# Patient Record
Sex: Female | Born: 1965 | Race: White | Hispanic: No | Smoking: Former smoker
Health system: Southern US, Community
[De-identification: ages and names within clinical notes are randomized; demographics above are authoritative.]

## PROBLEM LIST (undated history)

## (undated) DIAGNOSIS — K219 Gastro-esophageal reflux disease without esophagitis: Secondary | ICD-10-CM

## (undated) DIAGNOSIS — IMO0002 Reserved for concepts with insufficient information to code with codable children: Secondary | ICD-10-CM

## (undated) DIAGNOSIS — M329 Systemic lupus erythematosus, unspecified: Secondary | ICD-10-CM

## (undated) DIAGNOSIS — M069 Rheumatoid arthritis, unspecified: Secondary | ICD-10-CM

## (undated) DIAGNOSIS — I10 Essential (primary) hypertension: Secondary | ICD-10-CM

## (undated) DIAGNOSIS — E282 Polycystic ovarian syndrome: Secondary | ICD-10-CM

## (undated) DIAGNOSIS — M797 Fibromyalgia: Secondary | ICD-10-CM

## (undated) DIAGNOSIS — E039 Hypothyroidism, unspecified: Secondary | ICD-10-CM

## (undated) HISTORY — PX: TURBINATE REDUCTION: SHX6157

## (undated) HISTORY — PX: TUBAL LIGATION: SHX77

## (undated) HISTORY — PX: WISDOM TOOTH EXTRACTION: SHX21

## (undated) HISTORY — PX: CARPAL TUNNEL RELEASE: SHX101

## (undated) HISTORY — PX: BLADDER SUSPENSION: SHX72

## (undated) HISTORY — PX: BREAST BIOPSY: SHX20

## (undated) HISTORY — PX: TONSILLECTOMY: SUR1361

---

## 2017-11-04 HISTORY — PX: BREAST BIOPSY: SHX20

## 2018-04-16 ENCOUNTER — Inpatient Hospital Stay
Admission: RE | Admit: 2018-04-16 | Discharge: 2018-04-16 | Disposition: A | Discharge status: Home / Self Care | Payer: Self-pay | Source: Ambulatory Visit | Attending: *Deleted | Admitting: *Deleted

## 2018-04-16 ENCOUNTER — Other Ambulatory Visit: Payer: Self-pay | Admitting: *Deleted

## 2018-04-16 DIAGNOSIS — Z9289 Personal history of other medical treatment: Secondary | ICD-10-CM

## 2018-04-17 ENCOUNTER — Other Ambulatory Visit: Payer: Self-pay | Admitting: Family Medicine

## 2018-04-17 ENCOUNTER — Other Ambulatory Visit: Payer: Self-pay | Admitting: *Deleted

## 2018-04-17 ENCOUNTER — Inpatient Hospital Stay
Admission: RE | Admit: 2018-04-17 | Discharge: 2018-04-17 | Disposition: A | Discharge status: Home / Self Care | Payer: Self-pay | Source: Ambulatory Visit | Attending: *Deleted | Admitting: *Deleted

## 2018-04-17 DIAGNOSIS — Z9289 Personal history of other medical treatment: Secondary | ICD-10-CM

## 2018-04-17 DIAGNOSIS — R921 Mammographic calcification found on diagnostic imaging of breast: Secondary | ICD-10-CM

## 2018-04-17 DIAGNOSIS — R928 Other abnormal and inconclusive findings on diagnostic imaging of breast: Secondary | ICD-10-CM

## 2018-05-01 ENCOUNTER — Ambulatory Visit
Admission: RE | Admit: 2018-05-01 | Discharge: 2018-05-01 | Disposition: A | Discharge status: Home / Self Care | Payer: Self-pay | Source: Ambulatory Visit | Attending: Family Medicine | Admitting: Family Medicine

## 2018-05-01 ENCOUNTER — Encounter: Payer: Self-pay | Admitting: Radiology

## 2018-05-01 DIAGNOSIS — R928 Other abnormal and inconclusive findings on diagnostic imaging of breast: Secondary | ICD-10-CM | POA: Insufficient documentation

## 2018-05-18 ENCOUNTER — Other Ambulatory Visit: Payer: Self-pay | Admitting: *Deleted

## 2018-05-18 ENCOUNTER — Inpatient Hospital Stay
Admission: RE | Admit: 2018-05-18 | Discharge: 2018-05-18 | Disposition: A | Discharge status: Home / Self Care | Payer: Self-pay | Source: Ambulatory Visit | Attending: *Deleted | Admitting: *Deleted

## 2018-05-18 DIAGNOSIS — Z9289 Personal history of other medical treatment: Secondary | ICD-10-CM

## 2018-05-22 ENCOUNTER — Ambulatory Visit
Admission: RE | Admit: 2018-05-22 | Discharge: 2018-05-22 | Disposition: A | Discharge status: Home / Self Care | Payer: Self-pay | Source: Ambulatory Visit | Attending: Family Medicine | Admitting: Family Medicine

## 2018-05-22 DIAGNOSIS — R928 Other abnormal and inconclusive findings on diagnostic imaging of breast: Secondary | ICD-10-CM | POA: Insufficient documentation

## 2018-05-25 ENCOUNTER — Other Ambulatory Visit: Payer: Self-pay | Admitting: Family Medicine

## 2018-05-25 DIAGNOSIS — R928 Other abnormal and inconclusive findings on diagnostic imaging of breast: Secondary | ICD-10-CM

## 2018-05-26 ENCOUNTER — Encounter: Payer: Self-pay | Admitting: *Deleted

## 2018-05-26 NOTE — Progress Notes (Signed)
  Oncology Nurse Navigator Documentation  Navigator Location: [facility] (05/26/18 0900)   )Navigator Encounter Type: Telephone (05/26/18 0900) Telephone: Lahoma Crocker Call (05/26/18 0900) Abnormal Finding Date: 05/22/18 (05/26/18 0900)                     Barriers/Navigation Needs: Financial;Coordination of Care (05/26/18 0900)   Interventions: Coordination of Care (05/26/18 0900)                      Time Spent with Patient: 15 (05/26/18 0900)   Patient had birads 4 mammogram through our [facility].  Screened patient today for BCCCP eligibility.  She does not meet financial eligibility for BCCCP.  Biopsy can be completed through our [facility].  Will inbasket Samatha.

## 2018-05-29 ENCOUNTER — Ambulatory Visit
Admission: RE | Admit: 2018-05-29 | Discharge: 2018-05-29 | Disposition: A | Discharge status: Home / Self Care | Payer: Self-pay | Source: Ambulatory Visit | Attending: Family Medicine | Admitting: Family Medicine

## 2018-05-29 DIAGNOSIS — R928 Other abnormal and inconclusive findings on diagnostic imaging of breast: Secondary | ICD-10-CM | POA: Insufficient documentation

## 2018-06-01 LAB — SURGICAL PATHOLOGY

## 2018-09-19 IMAGING — MG MM DIGITAL DIAGNOSTIC UNILAT*L* W/ TOMO W/ CAD
6 series · 6 of 10 positions shown · non-contrast
Comparison: Previous exam(s).

CLINICAL DATA: 52-year-old female recalled from outside screening
mammogram dated 02/24/2018 for left breast and left axillary
calcifications.

EXAM:
DIGITAL DIAGNOSTIC LEFT MAMMOGRAM WITH CAD AND TOMO
ULTRASOUND LEFT BREAST

[L CC (1 of 2)]
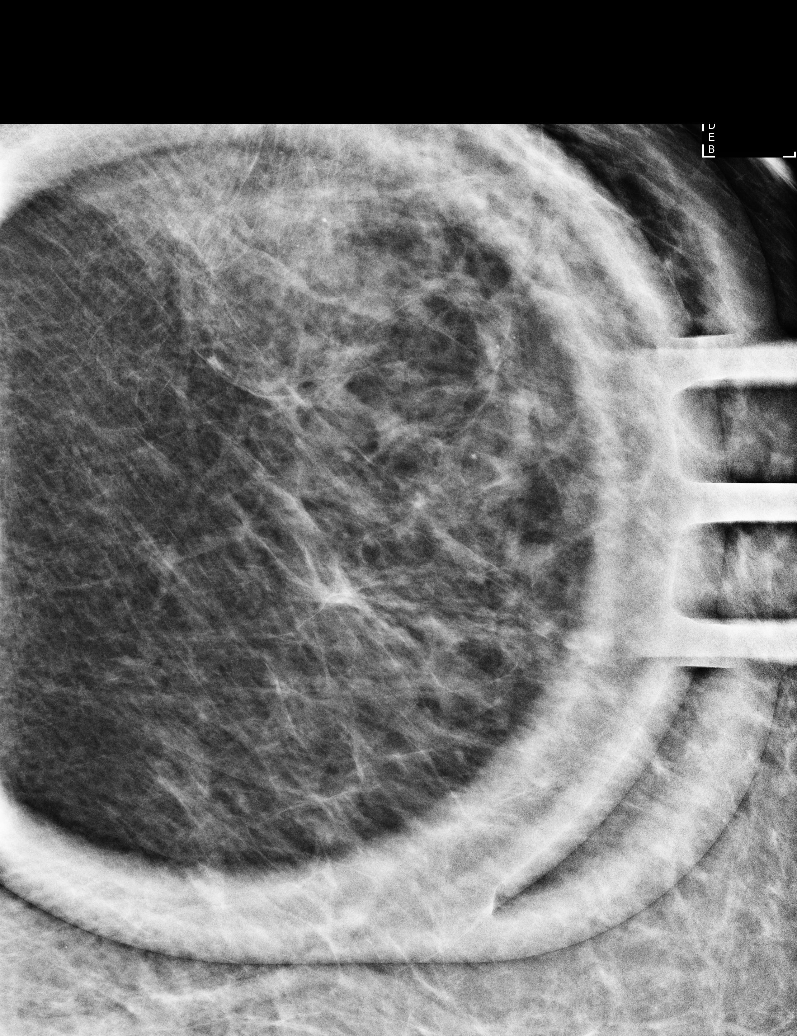

[L ML]
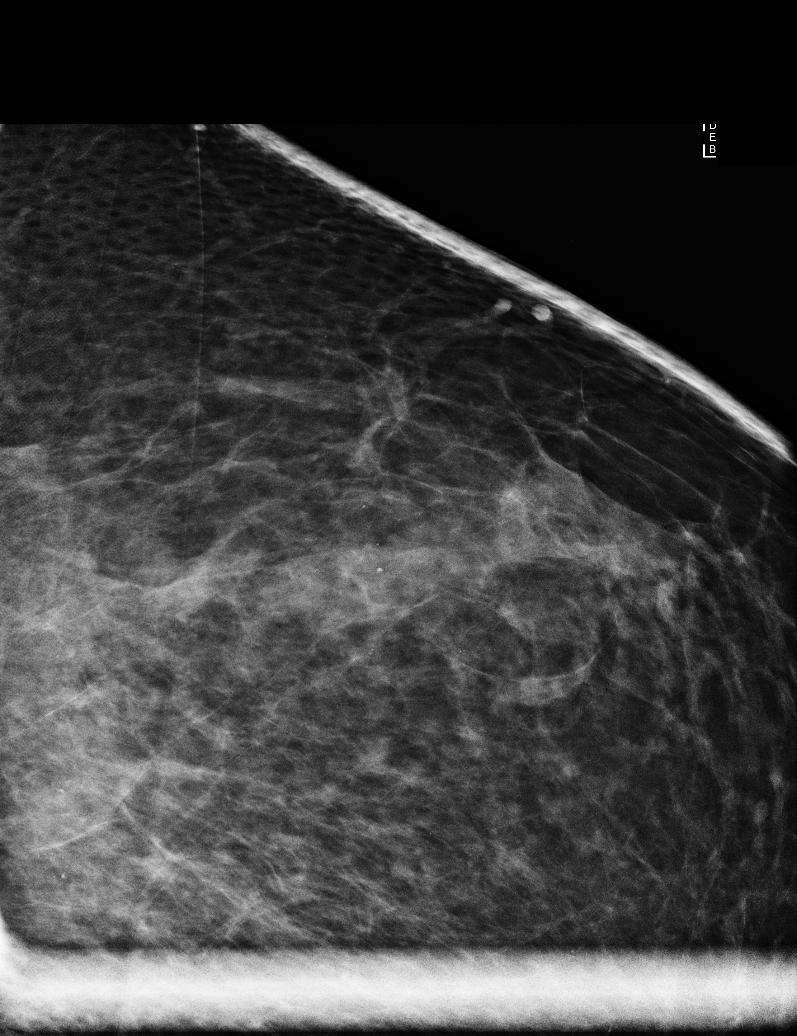

[L MLO]
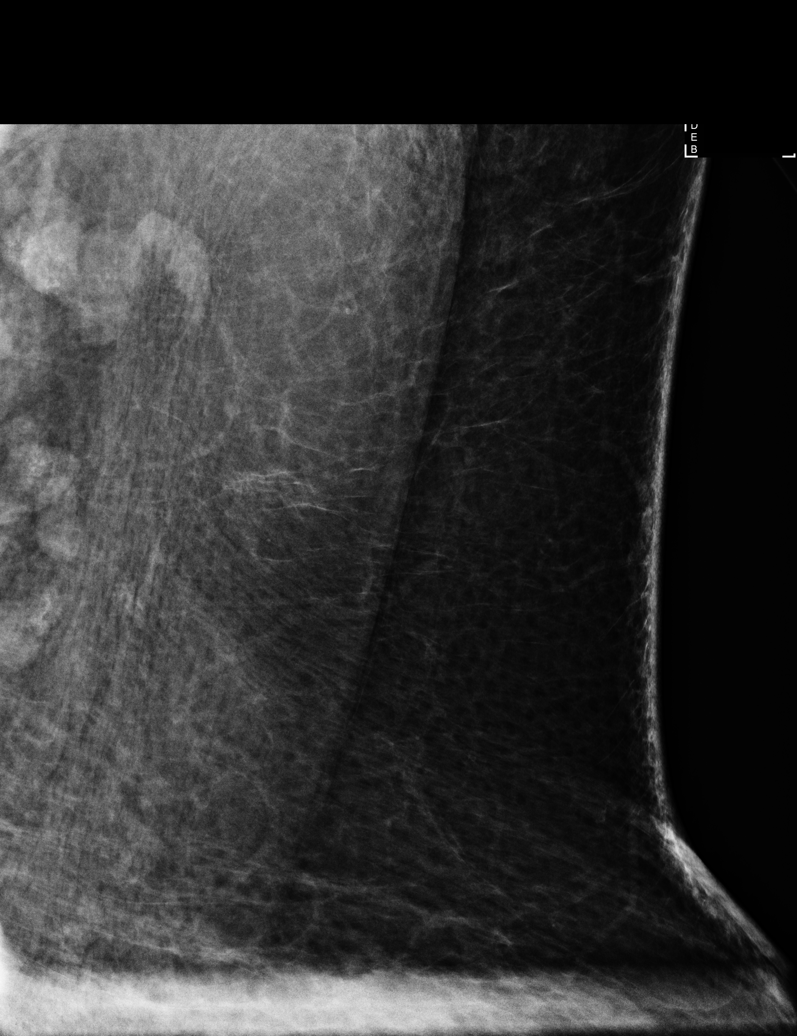

[L CC (2 of 2)]
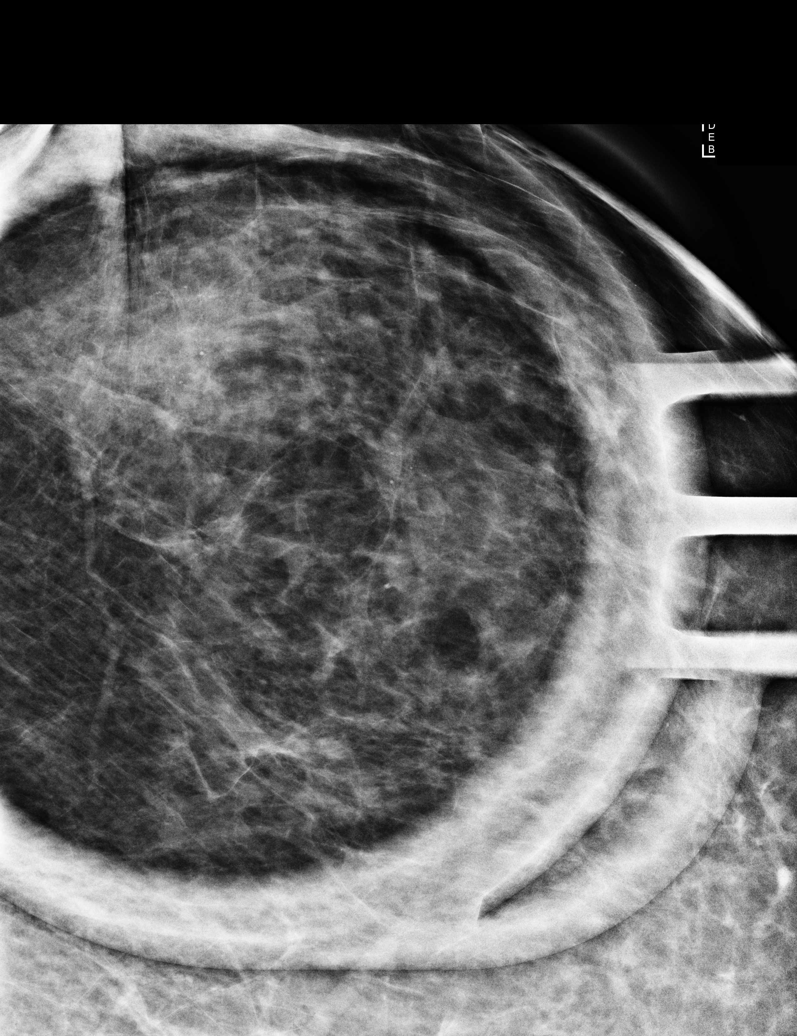

[L ML synth-2D]
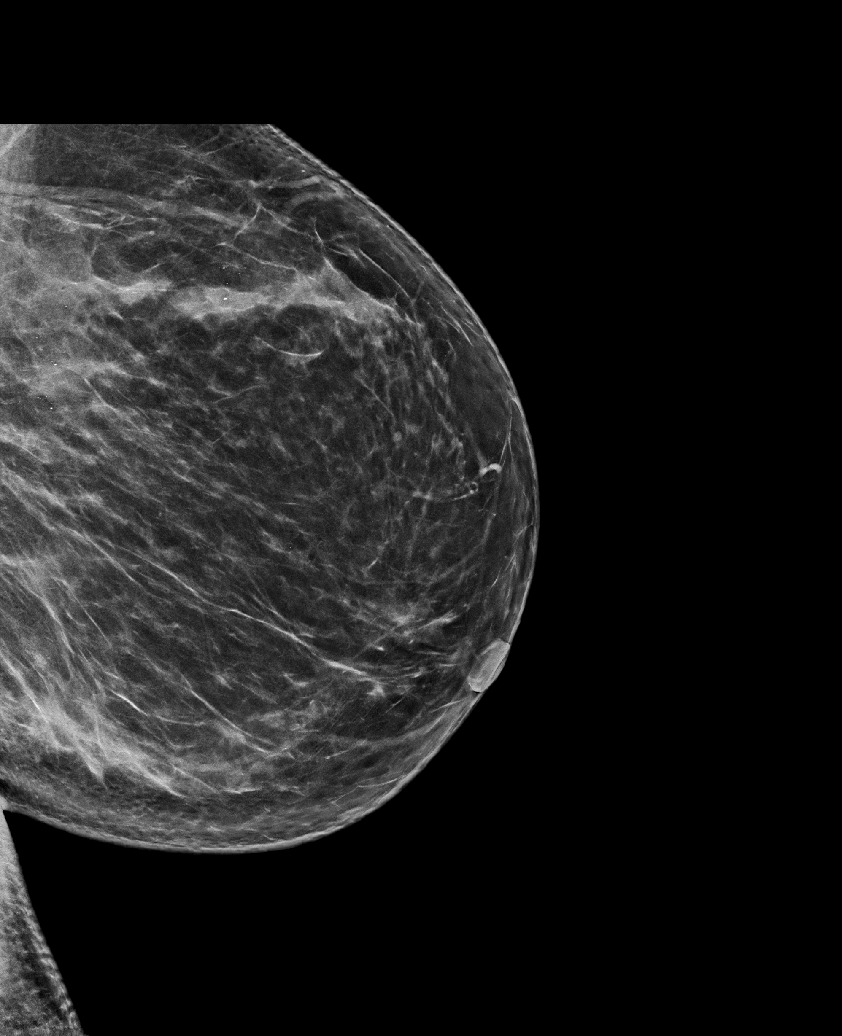

[L ML tomo · tomo slice 41/80.0]
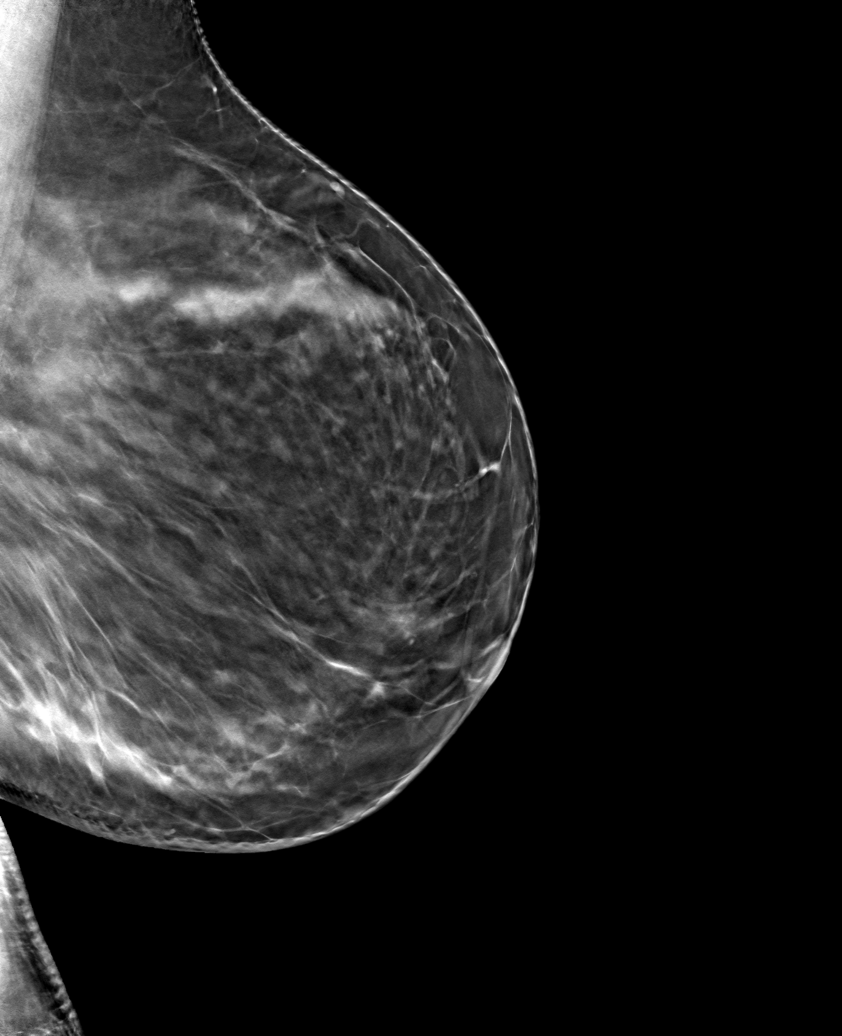

[6 of 10 positions shown; findings below may reference images not displayed]

ACR Breast Density Category c: The breast tissue is heterogeneously
dense, which may obscure small masses.
FINDINGS: Loosely grouped calcifications in the upper-outer quadrant of the
left breast have a punctate morphology. Additional magnification
views of the left axilla demonstrate pleomorphic appearing
calcifications within several left axillary lymph nodes.

Mammographic images were processed with CAD.

On physical exam, note is made of a tattoo along the upper medial
aspect of the patient's left breast.

Targeted ultrasound is performed, showing an indeterminate lymph
node in the left axilla demonstrating up to 0.4 cm of cortical
thickening and internal hyperechoic foci. This likely correlates
with the mammographic finding. Additional morphologically normal
lymph nodes are also noted.
IMPRESSION: 1. Indeterminate left axillary lymph node demonstrating up to 0.4 cm
of cortical thickening and internal hyperechoic foci likely
corresponding with the mammographic finding.
2. Probably benign calcifications in the upper-outer quadrant of the
left breast corresponding with the screening mammographic findings.

RECOMMENDATION:
1. Recommendation is for ultrasound-guided biopsy of 1 of the lymph
nodes demonstrating internal hyperechoic foci with specimen
radiographs to demonstrate adequate sampling of the mammographically
noted calcifications. Findings may be related to pigmentation from
the patient's tattoo.
2. If the above findings are benign, recommendation is for six-month
mammographic follow-up for the left breast calcifications. If the
findings demonstrate malignancy, additional stereotactic biopsy is
recommended.

I have discussed the findings and recommendations with the patient.
Results were also provided in writing at the conclusion of the
visit. If applicable, a reminder letter will be sent to the patient
regarding the next appointment.

BI-RADS CATEGORY  4: Suspicious.

## 2018-10-15 ENCOUNTER — Ambulatory Visit: Payer: Self-pay | Admitting: Nutrition

## 2018-11-10 ENCOUNTER — Encounter: Payer: Self-pay | Admitting: Nutrition

## 2018-11-10 ENCOUNTER — Encounter: Payer: Self-pay | Attending: Family Medicine | Admitting: Nutrition

## 2018-11-10 DIAGNOSIS — R739 Hyperglycemia, unspecified: Secondary | ICD-10-CM | POA: Insufficient documentation

## 2018-11-10 NOTE — Patient Instructions (Signed)
Goals 1 Follow MY Plate 2. Walk 30 minutes a day/150 minutes a week 3. Eat 2 carb choices per meal and be sure to eat protein and veggies with ti. 4. Lose 1 lb per week. Keep food journal daily

## 2018-11-10 NOTE — Progress Notes (Signed)
  Medical Nutrition Therapy:  Appt start time: 1400 end time:  1500.   Assessment:  Primary concerns today: Prediabetes, Obesity BMI 46, Hyperlipidemia and HTN. Lives with her husband.  Currently works part time for her church.  Eating to 3 meals per day.  Has been on Weight watchers, Rickard Patience, 21 day fix, and a keto diet in the past. PCP [facility]. Most she has weighed was 278 lbs. Lost 10 lbs recently.  Desired weight is 175 lbs. She was below 200 lbs 10 yrs ago. Physical activity: Walks some.  Currently  A1C is 5.8% and restarted on Metformin 500 mg BID, Victoza .6 per day. Donna Bernard has made her nauseated and sick on stomach but she is working through it. Had to reduce from 1.2 units a day due to chronic nausea.    Lipid profile elevated TG 161 mg/dl, LDL 139 mg/dl, HDL 46 mg/dl and NON HDL Chol is 157 mg/dl.   She has metabolic syndrome and at risk for cardiovascular complications based on current chronic problems.   Engaged to make changes with diet and exercise to lose weight and improve health. Current diet is inconsistent to meet her needs. Needs to avoid processed meats.   Preferred Learning Style:  No preference indicated   Learning Readiness:   Ready  Change in progress   MEDICATIONS:    DIETARY INTAKE:  24-hr recall:  B ( AM): 2 hard boiled eggs, or banana and PB, water Snk ( AM):  L ( PM):  Chicken nuggets grilled 8, water  Snk ( PM):  D ( PM): 2 pieces of spiral ham,  1 c green beans,, water Snk ( PM):  Beverages: water  Usual physical activity: ADL  Estimated energy needs: 1200  calories 135 g carbohydrates 90 g protein 33 g fat  Progress Towards Goal(s):  In progress.   Nutritional Diagnosis:  NB-1.1 Food and nutrition-related knowledge deficit As related to Pre DM, OBesity and Hyperlipidemia.  As evidenced by A1C 5.8%, LDL > 150 and BMI 46. .    Intervention:  Nutrition and Diabetes education provided on My Plate, CHO counting, meal planning, portion  sizes, timing of meals, avoiding snacks between meals unless having a low blood sugar, target ranges for A1C and blood sugars, signs/symptoms and treatment of hyper/hypoglycemia, monitoring blood sugars, taking medications as prescribed, benefits of exercising 30 minutes per day and prevention of complications of DM. High Fiber Low Salt Diet.   .Goals 1 Follow MY Plate 2. Walk 30 minutes a day/150 minutes a week 3. Eat 2 carb choices per meal and be sure to eat protein and veggies with ti. 4. Lose 1 lb per week. Keep food journal daily   Teaching Method Utilized:  Visual Auditory Hands on  Handouts given during visit include:  The Plate Method   Meal Plan Card   Barriers to learning/adherence to lifestyle change: none  Demonstrated degree of understanding via:  Teach Back   Monitoring/Evaluation:  Dietary intake, exercise, meal planning , and body weight in 1 month(s).

## 2018-11-23 ENCOUNTER — Encounter: Payer: Self-pay | Admitting: *Deleted

## 2018-11-26 ENCOUNTER — Other Ambulatory Visit: Payer: Self-pay | Admitting: Family Medicine

## 2018-11-26 DIAGNOSIS — Z1231 Encounter for screening mammogram for malignant neoplasm of breast: Secondary | ICD-10-CM

## 2018-11-26 DIAGNOSIS — R928 Other abnormal and inconclusive findings on diagnostic imaging of breast: Secondary | ICD-10-CM

## 2018-12-04 ENCOUNTER — Ambulatory Visit
Admission: RE | Admit: 2018-12-04 | Discharge: 2018-12-04 | Disposition: A | Discharge status: Home / Self Care | Payer: Self-pay | Source: Ambulatory Visit | Attending: Family Medicine | Admitting: Family Medicine

## 2018-12-04 DIAGNOSIS — R928 Other abnormal and inconclusive findings on diagnostic imaging of breast: Secondary | ICD-10-CM | POA: Insufficient documentation

## 2018-12-07 ENCOUNTER — Other Ambulatory Visit: Payer: Self-pay | Admitting: Family Medicine

## 2018-12-07 DIAGNOSIS — R921 Mammographic calcification found on diagnostic imaging of breast: Secondary | ICD-10-CM

## 2018-12-08 ENCOUNTER — Telehealth: Payer: Self-pay | Admitting: Gastroenterology

## 2018-12-08 NOTE — Telephone Encounter (Signed)
PT  Is calling to schedule a colonoscopy  Please call after 12:30 pm

## 2018-12-09 ENCOUNTER — Other Ambulatory Visit: Payer: Self-pay

## 2018-12-09 DIAGNOSIS — Z1211 Encounter for screening for malignant neoplasm of colon: Secondary | ICD-10-CM

## 2018-12-09 NOTE — Telephone Encounter (Signed)
Returned patients call.  Colonoscopy has been scheduled with Dr. Bonna Gains on 01/15/19 at [facility].  Thanks [employer]

## 2018-12-29 ENCOUNTER — Encounter: Payer: Self-pay | Attending: Family Medicine | Admitting: Nutrition

## 2018-12-29 VITALS — Ht 64.0 in | Wt 258.0 lb

## 2018-12-29 DIAGNOSIS — E118 Type 2 diabetes mellitus with unspecified complications: Secondary | ICD-10-CM | POA: Insufficient documentation

## 2018-12-29 DIAGNOSIS — IMO0002 Reserved for concepts with insufficient information to code with codable children: Secondary | ICD-10-CM

## 2018-12-29 DIAGNOSIS — E1165 Type 2 diabetes mellitus with hyperglycemia: Secondary | ICD-10-CM | POA: Insufficient documentation

## 2018-12-29 NOTE — Patient Instructions (Addendum)
Goals  Increase exercise 150 minutes a week -back to gym. Increase high fiber foods. Lose 3-4 lbs per month Reherse situation in the house.

## 2018-12-29 NOTE — Progress Notes (Signed)
  Medical Nutrition Therapy:  Appt start time: 1430 end time:  1500.   Assessment:  Primary concerns today: Diabetes Type 2, Obesity BMI 46, Hyperlipidemia and HTN. Lives with her husband.  A1C down to 5.3% Was as high as 6.5% at one point. from 5.8%.  Stil taking Victoza and Metformin 500 mg BID. Has been tracking her food in myfitnesspal. Has been exercising. She has been working on eating more regular schedule.Has been drinking more water.   Hasn't been buying sweets and junk food. She has lost 10 lbs since last visit. Has lost 22 lbs in the last 3 months. Nausea is much better and now back up to 1.8 of the Victoza.   Preferred Learning Style:  No preference indicated   Learning Readiness:   Ready  Change in progress   MEDICATIONS:    DIETARY INTAKE:  24-hr recall:  B ( AM): Cottage cheese and fruit,water Snk ( AM):  L ( PM):  Sandwich and salad,  water Snk ( PM):  D ( PM): 2 pieces of spiral ham,  1 c green beans,, water Snk ( PM):  Beverages: water  Usual physical activity: ADL  Estimated energy needs: 1200  calories 135 g carbohydrates 90 g protein 33 g fat  Progress Towards Goal(s):  In progress.   Nutritional Diagnosis:  NB-1.1 Food and nutrition-related knowledge deficit As related to Pre DM, OBesity and Hyperlipidemia.  As evidenced by A1C 5.8%, LDL > 150 and BMI 46. .    Intervention:  Nutrition and Diabetes education provided on My Plate, CHO counting, meal planning, portion sizes, timing of meals, avoiding snacks between meals unless having a low blood sugar, target ranges for A1C and blood sugars, signs/symptoms and treatment of hyper/hypoglycemia, monitoring blood sugars, taking medications as prescribed, benefits of exercising 30 minutes per day and prevention of complications of DM. High Fiber Low Salt Diet.   Goals  Increase exercise 150 minutes a week -back to gym. Increase high fiber foods. Lose 3-4 lbs per month Reherse situation in the  house.   Teaching Method Utilized:  Visual Auditory Hands on  Handouts given during visit include:  The Plate Method   Meal Plan Card   Barriers to learning/adherence to lifestyle change: none  Demonstrated degree of understanding via:  Teach Back   Monitoring/Evaluation:  Dietary intake, exercise, meal planning , and body weight in 3 month(s).

## 2018-12-30 ENCOUNTER — Encounter: Payer: Self-pay | Admitting: Nutrition

## 2019-01-08 ENCOUNTER — Telehealth: Payer: Self-pay

## 2019-01-08 NOTE — Telephone Encounter (Signed)
LVM returning patients call regarding colonoscopy cost.  Left her detailed message based on price list at office informing her the following:  Screening colonoscopy 45378 $328.50 Biopsy 45380 $402.75 Polyp 45385 $461.25  Thanks  Sharyn Lull

## 2019-01-15 ENCOUNTER — Encounter: Payer: Self-pay | Admitting: *Deleted

## 2019-01-15 ENCOUNTER — Ambulatory Visit: Payer: Self-pay | Admitting: Anesthesiology

## 2019-01-15 ENCOUNTER — Other Ambulatory Visit: Payer: Self-pay

## 2019-01-15 ENCOUNTER — Encounter
Admission: RE | Disposition: A | Discharge status: Home / Self Care | Payer: Self-pay | Source: Ambulatory Visit | Attending: Gastroenterology

## 2019-01-15 ENCOUNTER — Ambulatory Visit
Admission: RE | Admit: 2019-01-15 | Discharge: 2019-01-15 | Disposition: A | Discharge status: Home / Self Care | Payer: Self-pay | Source: Ambulatory Visit | Attending: Gastroenterology | Admitting: Gastroenterology

## 2019-01-15 DIAGNOSIS — E282 Polycystic ovarian syndrome: Secondary | ICD-10-CM | POA: Insufficient documentation

## 2019-01-15 DIAGNOSIS — M797 Fibromyalgia: Secondary | ICD-10-CM | POA: Insufficient documentation

## 2019-01-15 DIAGNOSIS — D125 Benign neoplasm of sigmoid colon: Secondary | ICD-10-CM | POA: Insufficient documentation

## 2019-01-15 DIAGNOSIS — Z1211 Encounter for screening for malignant neoplasm of colon: Secondary | ICD-10-CM

## 2019-01-15 DIAGNOSIS — Z7984 Long term (current) use of oral hypoglycemic drugs: Secondary | ICD-10-CM | POA: Insufficient documentation

## 2019-01-15 DIAGNOSIS — K219 Gastro-esophageal reflux disease without esophagitis: Secondary | ICD-10-CM | POA: Insufficient documentation

## 2019-01-15 DIAGNOSIS — K5730 Diverticulosis of large intestine without perforation or abscess without bleeding: Secondary | ICD-10-CM | POA: Insufficient documentation

## 2019-01-15 DIAGNOSIS — M329 Systemic lupus erythematosus, unspecified: Secondary | ICD-10-CM | POA: Insufficient documentation

## 2019-01-15 DIAGNOSIS — M069 Rheumatoid arthritis, unspecified: Secondary | ICD-10-CM | POA: Insufficient documentation

## 2019-01-15 DIAGNOSIS — Z6841 Body Mass Index (BMI) 40.0 and over, adult: Secondary | ICD-10-CM | POA: Insufficient documentation

## 2019-01-15 DIAGNOSIS — Z882 Allergy status to sulfonamides status: Secondary | ICD-10-CM | POA: Insufficient documentation

## 2019-01-15 DIAGNOSIS — Z87891 Personal history of nicotine dependence: Secondary | ICD-10-CM | POA: Insufficient documentation

## 2019-01-15 DIAGNOSIS — E039 Hypothyroidism, unspecified: Secondary | ICD-10-CM | POA: Insufficient documentation

## 2019-01-15 DIAGNOSIS — I10 Essential (primary) hypertension: Secondary | ICD-10-CM | POA: Insufficient documentation

## 2019-01-15 DIAGNOSIS — Z881 Allergy status to other antibiotic agents status: Secondary | ICD-10-CM | POA: Insufficient documentation

## 2019-01-15 DIAGNOSIS — K635 Polyp of colon: Secondary | ICD-10-CM

## 2019-01-15 DIAGNOSIS — Z91041 Radiographic dye allergy status: Secondary | ICD-10-CM | POA: Insufficient documentation

## 2019-01-15 HISTORY — DX: Rheumatoid arthritis, unspecified: M06.9

## 2019-01-15 HISTORY — DX: Hypothyroidism, unspecified: E03.9

## 2019-01-15 HISTORY — DX: Essential (primary) hypertension: I10

## 2019-01-15 HISTORY — DX: Gastro-esophageal reflux disease without esophagitis: K21.9

## 2019-01-15 HISTORY — DX: Systemic lupus erythematosus, unspecified: M32.9

## 2019-01-15 HISTORY — PX: COLONOSCOPY WITH PROPOFOL: SHX5780

## 2019-01-15 HISTORY — DX: Reserved for concepts with insufficient information to code with codable children: IMO0002

## 2019-01-15 HISTORY — DX: Polycystic ovarian syndrome: E28.2

## 2019-01-15 HISTORY — DX: Fibromyalgia: M79.7

## 2019-01-15 LAB — POCT PREGNANCY, URINE: Preg Test, Ur: NEGATIVE

## 2019-01-15 SURGERY — COLONOSCOPY WITH PROPOFOL
Anesthesia: General

## 2019-01-15 MED ORDER — FENTANYL CITRATE (PF) 100 MCG/2ML IJ SOLN
INTRAMUSCULAR | Status: AC
  Filled 2019-01-15: qty 2

## 2019-01-15 MED ORDER — PROPOFOL 500 MG/50ML IV EMUL
INTRAVENOUS | Status: AC
  Filled 2019-01-15: qty 50

## 2019-01-15 MED ORDER — MIDAZOLAM HCL 2 MG/2ML IJ SOLN
INTRAMUSCULAR | Status: DC | PRN
  Administered 2019-01-15: 2 mg via INTRAVENOUS

## 2019-01-15 MED ORDER — PROPOFOL 500 MG/50ML IV EMUL
INTRAVENOUS | Status: DC | PRN
  Administered 2019-01-15: 120 ug/kg/min via INTRAVENOUS

## 2019-01-15 MED ORDER — SODIUM CHLORIDE 0.9 % IV SOLN
INTRAVENOUS | Status: DC
  Administered 2019-01-15: 08:00:00 via INTRAVENOUS

## 2019-01-15 MED ORDER — MIDAZOLAM HCL 2 MG/2ML IJ SOLN
INTRAMUSCULAR | Status: AC
  Filled 2019-01-15: qty 2

## 2019-01-15 MED ORDER — FENTANYL CITRATE (PF) 100 MCG/2ML IJ SOLN
INTRAMUSCULAR | Status: DC | PRN
  Administered 2019-01-15: 50 ug via INTRAVENOUS

## 2019-01-15 NOTE — Anesthesia Postprocedure Evaluation (Signed)
Anesthesia Post Note  Patient: Dayanara Sherrill  Procedure(s) Performed: COLONOSCOPY WITH PROPOFOL (N/A )  Patient location during evaluation: PACU Anesthesia Type: General Level of consciousness: awake and alert Pain management: pain level controlled Vital Signs Assessment: post-procedure vital signs reviewed and stable Respiratory status: spontaneous breathing, nonlabored ventilation, respiratory function stable and patient connected to nasal cannula oxygen Cardiovascular status: blood pressure returned to baseline and stable Postop Assessment: no apparent nausea or vomiting Anesthetic complications: no     Last Vitals:  Vitals:   01/15/19 0852 01/15/19 0902  BP: 108/79 116/76  Pulse: 76   Resp: 17 17  Temp: (!) 36.1 C   SpO2: 99%     Last Pain:  Vitals:   01/15/19 0902  TempSrc:   PainSc: 0-No pain                 Molli Barrows

## 2019-01-15 NOTE — Transfer of Care (Signed)
    Immediate Anesthesia Transfer of Care Note  Patient: Kerry Moore  Procedure(s) Performed: COLONOSCOPY WITH PROPOFOL (N/A )  Patient Location: PACU  Anesthesia Type:General  Level of Consciousness: awake and sedated  Airway & Oxygen Therapy: Patient Spontanous Breathing and Patient connected to nasal cannula oxygen  Post-op Assessment: Report given to RN and Post -op Vital signs reviewed and stable  Post vital signs: Reviewed and stable  Last Vitals:  Vitals Value Taken Time  BP    Temp    Pulse    Resp    SpO2      Last Pain:  Vitals:   01/15/19 0739  TempSrc: Tympanic  PainSc: 0-No pain         Complications: No apparent anesthesia complications

## 2019-01-15 NOTE — Anesthesia Procedure Notes (Signed)
Performed by: Cook-Martin, Fedrick Cefalu Pre-anesthesia Checklist: Patient identified, Emergency Drugs available, Suction available, Patient being monitored and Timeout performed Patient Re-evaluated:Patient Re-evaluated prior to induction Oxygen Delivery Method: Nasal cannula Preoxygenation: Pre-oxygenation with 100% oxygen Induction Type: IV induction Placement Confirmation: positive ETCO2 and CO2 detector       

## 2019-01-15 NOTE — Op Note (Signed)
[facility] Gastroenterology Patient Name: Kerry Moore Procedure Date: 01/15/2019 8:08 AM MRN: [id number] Account #: [id number] Date of Birth: 12/29/1965 Admit Type: Outpatient Age: 53 Room: [facility] ENDO ROOM 2 Gender: Female Note Status: Finalized Procedure:            Colonoscopy Indications:          Screening for colorectal malignant neoplasm Providers:            Aisley Whan B. Bonna Gains MD, MD Referring MD:         Jola Schmidt Medicines:            Monitored Anesthesia Care Complications:        No immediate complications. Procedure:            Pre-Anesthesia Assessment:                       - ASA Grade Assessment: II - A patient with mild                        systemic disease.                       - Prior to the procedure, a History and Physical was                        performed, and patient medications, allergies and                        sensitivities were reviewed. The patient's tolerance of                        previous anesthesia was reviewed.                       - The risks and benefits of the procedure and the                        sedation options and risks were discussed with the                        patient. All questions were answered and informed                        consent was obtained.                       - Patient identification and proposed procedure were                        verified prior to the procedure by the physician, the                        nurse, the anesthesiologist, the anesthetist and the                        technician. The procedure was verified in the procedure                        room.                       After obtaining informed  consent, the colonoscope was                        passed under direct vision. Throughout the procedure,                        the patient's blood pressure, pulse, and oxygen                        saturations were monitored continuously. The   Colonoscope was introduced through the anus and                        advanced to the the cecum, identified by appendiceal                        orifice and ileocecal valve. The colonoscopy was                        performed with ease. The patient tolerated the                        procedure well. The quality of the bowel preparation                        was good. Findings:      The perianal and digital rectal examinations were normal.      Two sessile polyps were found in the sigmoid colon. The polyps were 3 to       5 mm in size. These polyps were removed with a cold snare. Resection and       retrieval were complete.      A few small-mouthed diverticula were found in the sigmoid colon.      The exam was otherwise without abnormality.      The rectum, sigmoid colon, descending colon, transverse colon, ascending       colon and cecum appeared normal.      The retroflexed view of the distal rectum and anal verge was normal and       showed no anal or rectal abnormalities. Impression:           - Two 3 to 5 mm polyps in the sigmoid colon, removed                        with a cold snare. Resected and retrieved.                       - Diverticulosis in the sigmoid colon.                       - The examination was otherwise normal.                       - The rectum, sigmoid colon, descending colon,                        transverse colon, ascending colon and cecum are normal.                       - The distal rectum and anal verge are normal on  retroflexion view. Recommendation:       - Discharge patient to home (with escort).                       - Advance diet as tolerated.                       - Continue present medications.                       - Await pathology results.                       - Repeat colonoscopy in 5 years.                       - The findings and recommendations were discussed with                        the patient.                        - The findings and recommendations were discussed with                        the patient's family.                       - Return to primary care physician as previously                        scheduled.                       - High fiber diet. Procedure Code(s):    --- Professional ---                       [phone], Colonoscopy, flexible; with removal of tumor(s),                        polyp(s), or other lesion(s) by snare technique Diagnosis Code(s):    --- Professional ---                       Z12.11, Encounter for screening for malignant neoplasm                        of colon                       D12.5, Benign neoplasm of sigmoid colon                       K57.30, Diverticulosis of large intestine without                        perforation or abscess without bleeding CPT copyright 2018 American Medical Association. All rights reserved. The codes documented in this report are preliminary and upon coder review may  be revised to meet current compliance requirements.  Vonda Antigua, MD Margretta Sidle B. Bonna Gains MD, MD 01/15/2019 8:53:33 AM This report has been signed electronically. Number of Addenda: 0 Note Initiated On: 01/15/2019 8:08 AM Scope Withdrawal Time: 0 hours 20 minutes 52 seconds  Total Procedure Duration: 0  hours 29 minutes 22 seconds  Estimated Blood Loss: Estimated blood loss: none.      [facility]

## 2019-01-15 NOTE — Anesthesia Preprocedure Evaluation (Signed)
Anesthesia Evaluation  Patient identified by MRN, date of birth, ID band Patient awake    Reviewed: Allergy & Precautions, H&P , NPO status , Patient's Chart, lab work & pertinent test results, reviewed documented beta blocker date and time   Airway Mallampati: II   Neck ROM: full    Dental  (+) Poor Dentition   Pulmonary neg pulmonary ROS,    Pulmonary exam normal        Cardiovascular negative cardio ROS Normal cardiovascular exam Rhythm:regular Rate:Normal     Neuro/Psych negative neurological ROS  negative psych ROS   GI/Hepatic negative GI ROS, Neg liver ROS,   Endo/Other  Morbid obesity  Renal/GU negative Renal ROS  negative genitourinary   Musculoskeletal   Abdominal   Peds  Hematology negative hematology ROS (+)   Anesthesia Other Findings No past medical history on file. Past Surgical History: 2000s: BREAST BIOPSY; Left     Comment:  20 years ago-benign 2019: BREAST BIOPSY; Left     Comment:  lymph node- benign   Reproductive/Obstetrics negative OB ROS                             Anesthesia Physical Anesthesia Plan  ASA: III  Anesthesia Plan: General   Post-op Pain Management:    Induction:   PONV Risk Score and Plan:   Airway Management Planned:   Additional Equipment:   Intra-op Plan:   Post-operative Plan:   Informed Consent: I have reviewed the patients History and Physical, chart, labs and discussed the procedure including the risks, benefits and alternatives for the proposed anesthesia with the patient or authorized representative who has indicated his/her understanding and acceptance.     Dental Advisory Given  Plan Discussed with: CRNA  Anesthesia Plan Comments:         Anesthesia Quick Evaluation

## 2019-01-15 NOTE — H&P (Signed)
Vonda Antigua, MD [address] [place], [place], [place], [address] [id number], [place], [place], [place], [address] Phone: [phone]  Fax: [phone]  Primary Care Physician:  Alfonse Flavors, MD   Pre-Procedure History & Physical: HPI:  Kerry Moore is a 53 y.o. female is here for a colonoscopy.   Past Medical History:  Diagnosis Date  . Fibromyalgia   . GERD (gastroesophageal reflux disease)   . Hypertension   . Hypothyroidism   . Lupus ([place])   . Polycystic ovarian disease   . RA (rheumatoid arthritis) ([place])     Past Surgical History:  Procedure Laterality Date  . BLADDER SUSPENSION    . BREAST BIOPSY Left 2000s   20 years ago-benign  . BREAST BIOPSY Left 2019   lymph node- benign  . CARPAL TUNNEL RELEASE Bilateral   . TONSILLECTOMY    . TUBAL LIGATION    . TURBINATE REDUCTION    . WISDOM TOOTH EXTRACTION      Prior to Admission medications   Medication Sig Start Date End Date Taking? Authorizing Provider  hydrochlorothiazide (MICROZIDE) 12.5 MG capsule Take 12.5 mg by mouth daily.   Yes [provider]  levothyroxine (SYNTHROID, LEVOTHROID) 112 MCG tablet Take 56 mcg by mouth daily before breakfast.   Yes [provider]  liraglutide (VICTOZA) 18 MG/3ML SOPN Inject 0.6 mg into the skin every morning.   Yes [provider]  metFORMIN (GLUCOPHAGE) 500 MG tablet Take 500 mg by mouth 2 (two) times daily with a meal.   Yes [provider]  omeprazole (PRILOSEC) 20 MG capsule Take 20 mg by mouth daily.   Yes [provider]  sertraline (ZOLOFT) 50 MG tablet Take 50 mg by mouth daily.   Yes [provider]    Allergies as of 12/09/2018 - Review Complete 11/10/2018  Allergen Reaction Noted  . Erythromycin  11/10/2018  . Ivp dye [iodinated diagnostic agents]  11/10/2018  . Sulfa antibiotics  11/10/2018    History reviewed. No pertinent family history.  Social History   Socioeconomic  History  . Marital status: Married    Spouse name: Not on file  . Number of children: Not on file  . Years of education: Not on file  . Highest education level: Not on file  Occupational History  . Not on file  Social Needs  . Financial resource strain: Not on file  . Food insecurity:    Worry: Not on file    Inability: Not on file  . Transportation needs:    Medical: Not on file    Non-medical: Not on file  Tobacco Use  . Smoking status: Former Smoker    Last attempt to quit: 2008    Years since quitting: 12.2  . Smokeless tobacco: Never Used  Substance and Sexual Activity  . Alcohol use: Yes    Comment: rarely  . Drug use: Never  . Sexual activity: Yes  Lifestyle  . Physical activity:    Days per week: Not on file    Minutes per session: Not on file  . Stress: Not on file  Relationships  . Social connections:    Talks on phone: Not on file    Gets together: Not on file    Attends religious service: Not on file    Active member of club or organization: Not on file    Attends meetings of clubs or organizations: Not on file    Relationship status: Not on file  .  Intimate partner violence:    Fear of current or ex partner: Not on file    Emotionally abused: Not on file    Physically abused: Not on file    Forced sexual activity: Not on file  Other Topics Concern  . Not on file  Social History Narrative  . Not on file    Review of Systems: See HPI, otherwise negative ROS  Physical Exam: BP 131/85   Pulse 81   Temp (!) 96.1 F (35.6 C) (Tympanic)   Resp 18   Ht 5\' 4"  (1.626 m)   Wt 111.1 kg   SpO2 100%   BMI 42.05 kg/m  General:   Alert,  pleasant and cooperative in NAD Head:  Normocephalic and atraumatic. Neck:  Supple; no masses or thyromegaly. Lungs:  Clear throughout to auscultation, normal respiratory effort.    Heart:  +S1, +S2, Regular rate and rhythm, No edema. Abdomen:  Soft, nontender and nondistended. Normal bowel sounds, without guarding,  and without rebound.   Neurologic:  Alert and  oriented x4;  grossly normal neurologically.  Impression/Plan: Kerry Moore is here for a colonoscopy to be performed for average risk screening.  Risks, benefits, limitations, and alternatives regarding  colonoscopy have been reviewed with the patient.  Questions have been answered.  All parties agreeable.   Virgel Manifold, MD  01/15/2019, 8:13 AM

## 2019-01-15 NOTE — Anesthesia Post-op Follow-up Note (Signed)
Anesthesia QCDR form completed.        

## 2019-01-18 LAB — SURGICAL PATHOLOGY

## 2019-01-21 ENCOUNTER — Encounter: Payer: Self-pay | Admitting: Gastroenterology

## 2019-03-31 ENCOUNTER — Encounter: Payer: Self-pay | Attending: Family Medicine | Admitting: Nutrition

## 2019-03-31 ENCOUNTER — Other Ambulatory Visit: Payer: Self-pay

## 2019-03-31 VITALS — Ht 64.0 in | Wt 227.4 lb

## 2019-03-31 DIAGNOSIS — E1165 Type 2 diabetes mellitus with hyperglycemia: Secondary | ICD-10-CM | POA: Insufficient documentation

## 2019-03-31 DIAGNOSIS — E118 Type 2 diabetes mellitus with unspecified complications: Secondary | ICD-10-CM | POA: Insufficient documentation

## 2019-03-31 DIAGNOSIS — IMO0002 Reserved for concepts with insufficient information to code with codable children: Secondary | ICD-10-CM

## 2019-03-31 NOTE — Progress Notes (Signed)
  Medical Nutrition Therapy:  Appt start time: 1430 end time:  1500.   Assessment:  Primary concerns today: Diabetes Type 2, Obesity BMI 46, Hyperlipidemia and HTN. Lives with her husband.  A1C down to 5.3% Was as high as 6.5% at one point. from 5.8%.  Stil taking Victoza and Metformin 500 mg BID. Has been working on  getting 22-27 grams of fiber daily. Has been tracking her food in myfitnesspal. This helped a lot to be more aware.Has been exercising. She has been working on eating more regular schedule.Has been drinking more water.   Hasn't been buying sweets and junk food. She has lost 52 lbs since November 2019.. Nausea is much better and now back up to 1.8 of the Victoza. Drinks about 100 oz of water per day.. Eating a lot more fresh fruits and vegetables. Goals  Increase exercise 150 minutes a week -work in progress. Increase high fiber foods.- has done really well. Lose 3-4 lbs per month work in progress.  Preferred Learning Style:  No preference indicated   Learning Readiness:   Ready  Change in progress  MEDICATIONS:    DIETARY INTAKE:  24-hr recall:  B ( AM): Cottage cheese and fruit,water Snk ( AM):  L ( PM):  Sandwich and salad,  water Snk ( PM):  D ( PM): 2 pieces of spiral ham,  1 c green beans,, water Snk ( PM):  Beverages: water  Usual physical activity: ADL  Estimated energy needs: 1200  calories 135 g carbohydrates 90 g protein 33 g fat  Progress Towards Goal(s):  In progress.   Nutritional Diagnosis:  NB-1.1 Food and nutrition-related knowledge deficit As related to Pre DM, OBesity and Hyperlipidemia.  As evidenced by A1C 5.8%, LDL > 150 and BMI 46. .    Intervention:  Nutrition and Diabetes education provided on My Plate, CHO counting, meal planning, portion sizes, timing of meals, avoiding snacks between meals unless having a low blood sugar, target ranges for A1C and blood sugars, signs/symptoms and treatment of hyper/hypoglycemia, monitoring  blood sugars, taking medications as prescribed, benefits of exercising 30 minutes per day and prevention of complications of DM. High Fiber Low Salt Diet.    Goals Keep up the great job!! Increase exercise to 60 minutes 4 days a week Avoid saturated fast and processed foods Get LDL less than 150 mg/dl. Lose 2-3 lbs per month.   Teaching Method Utilized:  Visual Auditory Hands on  Handouts given during visit include:  The Plate Method   Meal Plan Card   Barriers to learning/adherence to lifestyle change: none  Demonstrated degree of understanding via:  Teach Back   Monitoring/Evaluation:  Dietary intake, exercise, meal planning , and body weight in 3 month(s).

## 2019-04-22 ENCOUNTER — Encounter: Payer: Self-pay | Admitting: Nutrition

## 2019-04-22 NOTE — Patient Instructions (Addendum)
Goals Keep up the great job!! Increase exercise to 60 minutes 4 days a week Avoid saturated fast and processed foods Get LDL less than 150 mg/dl. Lose 2-3 lbs per month.

## 2019-10-11 ENCOUNTER — Other Ambulatory Visit: Payer: Self-pay

## 2019-10-11 ENCOUNTER — Encounter: Payer: Self-pay | Admitting: Nutrition

## 2019-10-11 ENCOUNTER — Encounter: Payer: Self-pay | Attending: Family Medicine | Admitting: Nutrition

## 2019-10-11 VITALS — Ht 64.0 in | Wt 205.0 lb

## 2019-10-11 DIAGNOSIS — R739 Hyperglycemia, unspecified: Secondary | ICD-10-CM | POA: Insufficient documentation

## 2019-10-11 NOTE — Progress Notes (Signed)
  Medical Nutrition Therapy:  Appt start time: 1430 end time:  1500.   Assessment:  Primary concerns today: Diabetes Type 2, Obesity BMI 46, Hyperlipidemia and HTN.  Lives with her husband.  A1C down to 5.3% Was as high as 6.5% at one point. from 5.8%. Has been out of Victoza x 1 month. Has gained 5-6 lbs since not having Victoza. Still takes metformin 500 mg BID. Trying to work on eating better and still exercising.    Drinks about 100 oz of water per day.. Eating a lot more fresh fruits and vegetables. Goals  Increase exercise 150 minutes a week -work in progress. Increase high fiber foods.- has done really well. Lose 3-4 lbs per month work in progress.  Preferred Learning Style:  No preference indicated   Learning Readiness:   Ready  Change in progress  MEDICATIONS:    DIETARY INTAKE:  24-hr recall:  B ( AM):  HIgh protein oatmeal or Cottage cheese and fruit,water Snk ( AM):  L ( PM):  [place] bow,  Snk ( PM):  D ( PM): skipped dinner.  Snk ( PM):  Beverages: water  Usual physical activity: ADL  Estimated energy needs: 1200  calories 135 g carbohydrates 90 g protein 33 g fat  Progress Towards Goal(s):  In progress.   Nutritional Diagnosis:  NB-1.1 Food and nutrition-related knowledge deficit As related to Pre DM, OBesity and Hyperlipidemia.  As evidenced by A1C 5.8%, LDL > 150 and BMI 46. .    Intervention:  Nutrition and Diabetes education provided on My Plate, CHO counting, meal planning, portion sizes, timing of meals, avoiding snacks between meals unless having a low blood sugar, target ranges for A1C and blood sugars, signs/symptoms and treatment of hyper/hypoglycemia, monitoring blood sugars, taking medications as prescribed, benefits of exercising 30 minutes per day and prevention of complications of DM. High Fiber Low Salt Diet.    Goals Keep up the great job!! Increase exercise to 60 minutes 4 days a week- add more cardio and walking. Avoid  saturated fast and processed foods - done Get LDL less than 150 mg/dl. Lose 2-3 lbs per month.   Teaching Method Utilized:  Visual Auditory Hands on  Handouts given during visit include:  The Plate Method   Meal Plan Card High fiber diet  Barriers to learning/adherence to lifestyle change: none  Demonstrated degree of understanding via:  Teach Back   Monitoring/Evaluation:  Dietary intake, exercise, meal planning , and body weight in 3 month(s).

## 2019-10-11 NOTE — Patient Instructions (Signed)
  Goals Keep up the great job!! Increase exercise to 60 minutes 4 days a week- add more cardio and walking. Avoid saturated fast and processed foods - done Get LDL less than 150 mg/dl. Lose 2-3 lbs per month

## 2020-01-20 ENCOUNTER — Ambulatory Visit: Payer: Self-pay | Admitting: Nutrition

## 2020-02-29 ENCOUNTER — Ambulatory Visit: Payer: Self-pay | Admitting: Nutrition

## 2022-05-31 ENCOUNTER — Other Ambulatory Visit: Payer: Self-pay

## 2022-05-31 DIAGNOSIS — Z1231 Encounter for screening mammogram for malignant neoplasm of breast: Secondary | ICD-10-CM

## 2022-06-04 ENCOUNTER — Encounter: Payer: Self-pay | Admitting: *Deleted

## 2022-06-04 ENCOUNTER — Inpatient Hospital Stay
Admission: RE | Admit: 2022-06-04 | Discharge: 2022-06-04 | Disposition: A | Discharge status: Home / Self Care | Payer: Self-pay | Source: Ambulatory Visit | Attending: *Deleted | Admitting: *Deleted

## 2022-06-04 ENCOUNTER — Ambulatory Visit: Payer: Self-pay | Attending: Hematology and Oncology | Admitting: *Deleted

## 2022-06-04 ENCOUNTER — Other Ambulatory Visit: Payer: Self-pay | Admitting: *Deleted

## 2022-06-04 VITALS — BP 153/94 | Wt 255.9 lb

## 2022-06-04 DIAGNOSIS — Z1231 Encounter for screening mammogram for malignant neoplasm of breast: Secondary | ICD-10-CM

## 2022-06-04 DIAGNOSIS — Z01419 Encounter for gynecological examination (general) (routine) without abnormal findings: Secondary | ICD-10-CM

## 2022-06-04 NOTE — Progress Notes (Signed)
[name] is a 56 y.o. female who presents to [facility] clinic today with no complaints .  She presents for her clinical breast exam and mammogram.     Pap Smear: Pap not smear completed today. Last Pap smear was 10/2019 at [facility] clinic and was normal. Patient prefers to have her next pap at her PCP's office in December.  Per patient has no history of an abnormal Pap smear. Last Pap smear result is not available in Epic.   Physical exam: Breasts Breasts asymmetrical. No skin abnormalities bilateral breasts. No nipple retraction bilateral breasts. No nipple discharge bilateral breasts. No lymphadenopathy. No lumps palpated bilateral breasts.       Pelvic/Bimanual Pap is not indicated today    Smoking History: Patient has is a former smoker    Patient Navigation: Patient education provided. Taught self breast awareness.  Access to services provided for patient through [facility] program. No interpreter provided. No transportation provided   Colorectal Cancer Screening: Per patient has had colonoscopy completed on 01/15/19. No malignancy noted on pathology.  See results under [facility] tab in Epic.  No complaints today.    Breast and Cervical Cancer Risk Assessment: Patient does not have family history of breast cancer, known genetic mutations, or radiation treatment to the chest before age 59. Patient does not have history of cervical dysplasia, immunocompromised, or DES exposure in-utero.  Risk Assessment     Risk Scores       06/04/2022   Last edited by: Demetrius Revel, LPN   5-year risk: 1.5 %   Lifetime risk: 11.1 %            A: [facility]CP exam without pap smear  P: Referred patient to the [facility] for a screening mammogram. Patient had previous mammogram of birad3 at [place].  [place] needs further imaging from [facility] prior to scheduling patients appointment for a mammogram.  Appointment to be scheduled by [facility] upon receipt of images.  Will follow up per  [facility] protocol.  Rico Junker, RN 06/04/2022 3:06 PM

## 2022-06-13 ENCOUNTER — Ambulatory Visit
Admission: RE | Admit: 2022-06-13 | Discharge: 2022-06-13 | Disposition: A | Discharge status: Home / Self Care | Payer: Self-pay | Source: Ambulatory Visit | Attending: Obstetrics and Gynecology | Admitting: Obstetrics and Gynecology

## 2022-06-13 DIAGNOSIS — Z1231 Encounter for screening mammogram for malignant neoplasm of breast: Secondary | ICD-10-CM

## 2022-06-14 ENCOUNTER — Other Ambulatory Visit: Payer: Self-pay | Admitting: Obstetrics and Gynecology

## 2022-06-14 DIAGNOSIS — R921 Mammographic calcification found on diagnostic imaging of breast: Secondary | ICD-10-CM

## 2022-06-14 DIAGNOSIS — R928 Other abnormal and inconclusive findings on diagnostic imaging of breast: Secondary | ICD-10-CM

## 2022-07-05 ENCOUNTER — Ambulatory Visit
Admission: RE | Admit: 2022-07-05 | Discharge: 2022-07-05 | Disposition: A | Discharge status: Home / Self Care | Payer: Self-pay | Source: Ambulatory Visit | Attending: Obstetrics and Gynecology | Admitting: Obstetrics and Gynecology

## 2022-07-05 DIAGNOSIS — R921 Mammographic calcification found on diagnostic imaging of breast: Secondary | ICD-10-CM | POA: Insufficient documentation

## 2022-07-05 DIAGNOSIS — R928 Other abnormal and inconclusive findings on diagnostic imaging of breast: Secondary | ICD-10-CM | POA: Insufficient documentation

## 2022-07-09 ENCOUNTER — Other Ambulatory Visit: Payer: Self-pay | Admitting: Obstetrics and Gynecology

## 2022-07-09 DIAGNOSIS — R928 Other abnormal and inconclusive findings on diagnostic imaging of breast: Secondary | ICD-10-CM

## 2022-07-09 DIAGNOSIS — R921 Mammographic calcification found on diagnostic imaging of breast: Secondary | ICD-10-CM

## 2022-07-25 ENCOUNTER — Ambulatory Visit
Admission: RE | Admit: 2022-07-25 | Discharge: 2022-07-25 | Disposition: A | Discharge status: Home / Self Care | Payer: Self-pay | Source: Ambulatory Visit | Attending: Obstetrics and Gynecology | Admitting: Obstetrics and Gynecology

## 2022-07-25 DIAGNOSIS — R921 Mammographic calcification found on diagnostic imaging of breast: Secondary | ICD-10-CM | POA: Insufficient documentation

## 2022-07-25 DIAGNOSIS — R928 Other abnormal and inconclusive findings on diagnostic imaging of breast: Secondary | ICD-10-CM | POA: Insufficient documentation

## 2022-07-25 HISTORY — PX: OTHER SURGICAL HISTORY: SHX169

## 2022-07-26 LAB — SURGICAL PATHOLOGY

## 2022-08-22 ENCOUNTER — Ambulatory Visit (INDEPENDENT_AMBULATORY_CARE_PROVIDER_SITE_OTHER): Payer: 59 | Admitting: Urology

## 2022-08-22 ENCOUNTER — Encounter: Payer: Self-pay | Admitting: Urology

## 2022-08-22 VITALS — BP 135/76 | HR 97 | Ht 64.0 in | Wt 255.0 lb

## 2022-08-22 DIAGNOSIS — R319 Hematuria, unspecified: Secondary | ICD-10-CM

## 2022-08-22 LAB — URINALYSIS, ROUTINE W REFLEX MICROSCOPIC
Bilirubin, UA: NEGATIVE
Glucose, UA: NEGATIVE
Ketones, UA: NEGATIVE
Leukocytes,UA: NEGATIVE
Nitrite, UA: NEGATIVE
Protein,UA: NEGATIVE
Specific Gravity, UA: 1.02 (ref 1.005–1.030)
Urobilinogen, Ur: 0.2 mg/dL (ref 0.2–1.0)
pH, UA: 6.5 (ref 5.0–7.5)

## 2022-08-22 LAB — MICROSCOPIC EXAMINATION
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
## 2022-09-24 ENCOUNTER — Other Ambulatory Visit: Payer: 59

## 2022-09-24 ENCOUNTER — Telehealth: Payer: Self-pay

## 2022-09-24 DIAGNOSIS — R319 Hematuria, unspecified: Secondary | ICD-10-CM

## 2022-09-24 DIAGNOSIS — R8290 Unspecified abnormal findings in urine: Secondary | ICD-10-CM

## 2022-09-24 NOTE — Progress Notes (Signed)
U/A:  0-5 WBC, 3-10 RBC, many bacteria, nitrite negative.  Will send for urine culture.

## 2022-09-24 NOTE — Telephone Encounter (Signed)
-----   Message from Primus Bravo, MD sent at 09/24/2022  2:45 PM EST ----- Please notify patient that her urinalysis shows microscopic blood and bacteria.  I am sending the urine for a culture and will contact her with results when available.

## 2022-09-24 NOTE — Telephone Encounter (Signed)
Patient informed of MD response to ua and culture.

## 2022-09-25 LAB — URINALYSIS, ROUTINE W REFLEX MICROSCOPIC
Bilirubin, UA: NEGATIVE
Glucose, UA: NEGATIVE
Ketones, UA: NEGATIVE
Leukocytes,UA: NEGATIVE
Nitrite, UA: NEGATIVE
Protein,UA: NEGATIVE
Specific Gravity, UA: 1.015 (ref 1.005–1.030)
Urobilinogen, Ur: 0.2 mg/dL (ref 0.2–1.0)
pH, UA: 7 (ref 5.0–7.5)

## 2022-09-25 LAB — MICROSCOPIC EXAMINATION

## 2022-09-26 LAB — URINE CULTURE

## 2023-08-15 ENCOUNTER — Other Ambulatory Visit: Payer: Self-pay | Admitting: Family Medicine

## 2023-08-15 DIAGNOSIS — Z1231 Encounter for screening mammogram for malignant neoplasm of breast: Secondary | ICD-10-CM
